# Patient Record
Sex: Female | Born: 2000 | Race: White | Hispanic: No | Marital: Single | Smoking: Never smoker
Health system: Southern US, Community
[De-identification: ages and names within clinical notes are randomized; demographics above are authoritative.]

## PROBLEM LIST (undated history)

## (undated) DIAGNOSIS — F419 Anxiety disorder, unspecified: Secondary | ICD-10-CM

## (undated) DIAGNOSIS — K5900 Constipation, unspecified: Secondary | ICD-10-CM

## (undated) DIAGNOSIS — J45909 Unspecified asthma, uncomplicated: Secondary | ICD-10-CM

## (undated) DIAGNOSIS — K219 Gastro-esophageal reflux disease without esophagitis: Secondary | ICD-10-CM

## (undated) DIAGNOSIS — E039 Hypothyroidism, unspecified: Secondary | ICD-10-CM

## (undated) DIAGNOSIS — F32A Depression, unspecified: Secondary | ICD-10-CM

## (undated) DIAGNOSIS — K2970 Gastritis, unspecified, without bleeding: Secondary | ICD-10-CM

## (undated) HISTORY — DX: Depression, unspecified: F32.A

## (undated) HISTORY — PX: APPENDECTOMY: SHX54

## (undated) HISTORY — DX: Gastro-esophageal reflux disease without esophagitis: K21.9

---
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
## 2021-01-09 ENCOUNTER — Ambulatory Visit (INDEPENDENT_AMBULATORY_CARE_PROVIDER_SITE_OTHER): Payer: BC Managed Care – PPO | Admitting: Allergy and Immunology

## 2021-01-09 ENCOUNTER — Encounter: Payer: Self-pay | Admitting: Allergy and Immunology

## 2021-01-09 ENCOUNTER — Other Ambulatory Visit: Payer: Self-pay

## 2021-01-09 VITALS — BP 98/70 | HR 98 | Resp 18 | Ht <= 58 in | Wt 93.2 lb

## 2021-01-09 DIAGNOSIS — L500 Allergic urticaria: Secondary | ICD-10-CM | POA: Diagnosis not present

## 2021-01-09 DIAGNOSIS — T50905D Adverse effect of unspecified drugs, medicaments and biological substances, subsequent encounter: Secondary | ICD-10-CM

## 2021-01-09 DIAGNOSIS — R220 Localized swelling, mass and lump, head: Secondary | ICD-10-CM | POA: Diagnosis not present

## 2021-01-09 DIAGNOSIS — T7840XD Allergy, unspecified, subsequent encounter: Secondary | ICD-10-CM

## 2021-01-09 DIAGNOSIS — R21 Rash and other nonspecific skin eruption: Secondary | ICD-10-CM

## 2021-01-09 DIAGNOSIS — T781XXD Other adverse food reactions, not elsewhere classified, subsequent encounter: Secondary | ICD-10-CM | POA: Diagnosis not present

## 2021-01-09 MED ORDER — AUVI-Q 0.3 MG/0.3ML IJ SOAJ: INTRAMUSCULAR | 3 refills | Status: AC

## 2021-01-09 NOTE — Progress Notes (Signed)
[address] [place] - [place] - [place]   Dear [name],  Thank you for referring Grace Parsons to the [facility] [place] on 01/09/2021.   Below is a summation of this patient's evaluation and recommendations.  Thank you for your referral. I will keep you informed about this patient's response to treatment.   If you have any questions please do not hesitate to contact me.   Sincerely,  Jessica Priest, MD Allergy / Immunology [facility]   ______________________________________________________________________    NEW PATIENT NOTE  Referring Provider: Morrell Riddle, PA-C Primary Provider: [name] Hacker, MD Date of office visit: 01/09/2021    Subjective:   Chief Complaint:  Grace Parsons (DOB: April 18, 2001) is a 20 y.o. female who presents to the clinic on 01/09/2021 with a chief complaint of Allergic Reaction (After taking Mefenamic Acid 250 mg) .     HPI: Grace Parsons presents to this clinic in evaluation of two issues.  First, she ended up in the emergency room on 15 December 2020 with a reaction directed against mefenamic acid.  She took 2 capsules of this agent for uterine cramps and within 1 hour she developed rash on her legs and then within another 30 minutes global urticaria and lip swelling and a throat type sensation for which she went to the emergency room and was treated with Pepcid and prednisone.  She was much better within about 1 hour after administration of those medicines and everything was dramatically improved within 6 hours after onset of her reaction when she woke up the next morning she had no symptoms.  She did have exposure to this agent 2 months prior during her menstrual period.  She has also had exposure to other nonsteroidal anti-inflammatory drugs including ibuprofen without any problem in the past.  Second, she gets very itchy throat and mouth when  eating bananas and peanuts and little Debbie frosting.  This has been an issue that has been present for years but has become much worse since around 2021.  She does not have any other associated atopic symptomatology.  Past Medical History:  Diagnosis Date  . Anxiety   . Asthma   . Constipation   . Depression    Phreesia 08/08/2020  . Gastritis   . GERD (gastroesophageal reflux disease)    Phreesia 08/08/2020  . Hypothyroid     Past Surgical History:  Procedure Laterality Date  . APPENDECTOMY    . LAPAROSCOPIC APPENDECTOMY N/A 07/17/2015   Procedure: APPENDECTOMY LAPAROSCOPIC;  Surgeon: Leonia Corona, MD;  Location: [facility] OR;  Service: Pediatrics;  Laterality: N/A;  . tubes in ears Bilateral 2004   x2    Allergies as of 01/09/2021      Reactions   Penicillins Hives   Buspirone Hcl Swelling   Throat tight and closing swelling sensation after 1 week of treatment   Lactose Intolerance (gi) Other (See Comments)   Abdominal pain   Other Hives   ANY "CILLIN" FAMILY MEDICATION   Penicillin G Hives   Amoxicillin Hives      Medication List      Loryna 3-0.02 MG tablet Generic drug: drospirenone-ethinyl estradiol Take 1 tablet by mouth daily.   mirtazapine 15 MG tablet Commonly known as: Remeron Take 1 tablet (15 mg total) by mouth at bedtime.       Review of systems negative except as noted in HPI / PMHx or  noted below:  Review of Systems  Constitutional: Negative.   HENT: Negative.   Eyes: Negative.   Respiratory: Negative.   Cardiovascular: Negative.   Gastrointestinal: Negative.   Genitourinary: Negative.   Musculoskeletal: Negative.   Skin: Negative.   Neurological: Negative.   Endo/Heme/Allergies: Negative.   Psychiatric/Behavioral: Negative.     Family History  Problem Relation Age of Onset  . Hypertension Mother   . Thyroid disease Maternal Uncle   . Diabetes Paternal Uncle   . Diabetes Maternal Grandmother   . Thyroid disease Maternal Grandmother    . Hypertension Maternal Grandmother   . Kidney disease Maternal Grandmother   . COPD Maternal Grandmother   . Asthma Maternal Grandmother   . Allergic rhinitis Father   . Asthma Brother     Social History   Socioeconomic History  . Marital status: Single    Spouse name: Not on file  . Number of children: Not on file  . Years of education: Not on file  . Highest education level: Not on file  Occupational History  . Not on file  Tobacco Use  . Smoking status: Never Smoker  . Smokeless tobacco: Never Used  Substance and Sexual Activity  . Alcohol use: Not on file  . Drug use: Not on file  . Sexual activity: Not on file  Other Topics Concern  . Not on file  Social History Narrative   Lives at home, is homeschooled is in 10th grade.    Environmental and Social history  Lives in a house with a dry environment, cat and dog located inside the household, carpet in the bedroom, no plastic on the bed, no plastic on the pillow, and no smoking ongoing with inside the household.  Objective:   Vitals:   01/09/21 1405  BP: 98/70  Pulse: 98  Resp: 18  SpO2: 97%   Height: 4\' 10"  (147.3 cm) Weight: 93 lb 3.2 oz (42.3 kg)  Physical Exam Constitutional:      Appearance: She is not diaphoretic.  HENT:     Head: Normocephalic. No right periorbital erythema or left periorbital erythema.     Right Ear: Tympanic membrane, ear canal and external ear normal.     Left Ear: Tympanic membrane, ear canal and external ear normal.     Nose: Nose normal. No mucosal edema or rhinorrhea.     Mouth/Throat:     Mouth: Oropharynx is clear and moist and mucous membranes are normal.     Pharynx: Uvula midline. No oropharyngeal exudate.  Eyes:     General: Lids are normal.     Conjunctiva/sclera: Conjunctivae normal.     Pupils: Pupils are equal, round, and reactive to light.  Neck:     Thyroid: No thyromegaly.     Trachea: Trachea normal. No tracheal tenderness or tracheal deviation.   Cardiovascular:     Rate and Rhythm: Normal rate and regular rhythm.     Heart sounds: Normal heart sounds, S1 normal and S2 normal. No murmur heard.   Pulmonary:     Effort: Pulmonary effort is normal. No respiratory distress.     Breath sounds: Normal breath sounds. No stridor. No wheezing or rales.  Chest:     Chest wall: No tenderness.  Abdominal:     General: There is no distension.     Palpations: Abdomen is soft. There is no hepatosplenomegaly or mass.     Tenderness: There is no abdominal tenderness. There is no guarding or rebound.  Musculoskeletal:  General: No tenderness or edema.  Lymphadenopathy:     Head:     Right side of head: No tonsillar adenopathy.     Left side of head: No tonsillar adenopathy.     Cervical: No cervical adenopathy.     Upper Body:  No axillary adenopathy present. Skin:    Coloration: Skin is not pale.     Findings: No erythema or rash.     Nails: There is no clubbing.  Neurological:     Mental Status: She is alert.     Diagnostics: Allergy skin tests were performed.  She did not demonstrate any hypersensitivity against a screening panel of aeroallergens.  Assessment and Plan:    1. Allergic reaction, subsequent encounter   2. Adverse effect of drug, subsequent encounter   3. Pollen-food allergy, subsequent encounter     1.  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  No mefenamic acid (NSAID)  3.  Auvi-Q 0.3, benadryl, MD/ER evaluation for allergic reaction  4. Further evaluation??? Yes, if recurrent allergic reactions.  5. NSAID use??? In clinic challenge???  Safiatou had a reaction to a nonsteroidal anti-inflammatory drug, specifically mefenamic acid.  She has taken ibuprofen in the past with no problem but I did inform her that if she plans on using nonsteroidal anti-inflammatory drugs in the future it would be best to come into the clinic to undergo a in clinic drug challenge with ibuprofen to make sure  that her sensitivity directed against mefenamic acid is unique to that drug and not directed against a broad class of nonsteroidal anti-inflammatory drugs.  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  I did inform her that we usually do not advise eating foods that cause itchiness of the mouth or throat.  Jessica Priest, MD Allergy / Immunology [facility] [place]

## 2021-01-09 NOTE — Patient Instructions (Addendum)
  1.  Allergen avoidance measures oral allergy syndrome  2.  No mefenamic acid (NSAID)  3.  Auvi-Q 0.3, benadryl, MD/ER evaluation for allergic reaction  4. Further evaluation??? Yes, if recurrent allergic reactions.  5. NSAID use??? In clinic challenge???

## 2021-01-10 ENCOUNTER — Encounter: Payer: Self-pay | Admitting: Allergy and Immunology

## 2021-03-27 ENCOUNTER — Telehealth: Payer: BC Managed Care – PPO | Admitting: Family

## 2021-03-27 DIAGNOSIS — N946 Dysmenorrhea, unspecified: Secondary | ICD-10-CM

## 2021-03-27 DIAGNOSIS — N921 Excessive and frequent menstruation with irregular cycle: Secondary | ICD-10-CM | POA: Diagnosis not present

## 2021-03-27 NOTE — Progress Notes (Signed)
THIS RECORD MAY CONTAIN CONFIDENTIAL INFORMATION THAT SHOULD NOT BE RELEASED WITHOUT REVIEW OF THE SERVICE PROVIDER.  Virtual Follow-Up Visit via Video Note  I connected with Grace Parsons  on 03/27/21 at  2:30 PM EDT by a video enabled telemedicine application and verified that I am speaking with the correct person using two identifiers.   Patient/parent location: home   I discussed the limitations of evaluation and management by telemedicine and the availability of in person appointments.  I discussed that the purpose of this telehealth visit is to provide medical care while limiting exposure to the novel coronavirus.  The patient expressed understanding and agreed to proceed.   Grace Parsons is a 20 y.o. female referred by [name] Hacker, MD here today for follow-up of dysmenorrhea, menorrhagia with irregular cycle.   History was provided by the patient.  Supervising Physician: Dr. Delorse Lek  Plan from Last Visit:   drospirenone-ethinyl estradiol (Loryna) 3-0.02 mg  Remeron 15 mg   Chief Complaint: Dysmenorrhea   History of Present Illness:  -nexplanon prior - started getting migraines about a month into the implant  -after removal, has been on OCPs - usually when first starts taking it cramping, period is gone but once body gets used to it, cramping and periods return  -has been rotating through pills to figure out what pills  -LMP 2 weeks ago, only bled 5 days, usually 7 days  -bleeding extremely heavy - changes pad every 2 hours for leakage, dizzy spells -thyroid hx: dizzy spells, stomach pains - was put on synthroid - felt throat closed;  -this month, frequently forgets throughout the week; period was lighter during period  -taking it when she remembers  -not sexually active  -significant FH of endometriosis requiring hysterectomy management   Allergies  Allergen Reactions  . Penicillins Hives  . Buspirone Hcl Swelling    Throat tight and closing swelling  sensation after 1 week of treatment  . Lactose Intolerance (Gi) Other (See Comments)    Abdominal pain  . Other Hives    ANY "CILLIN" FAMILY MEDICATION  . Penicillin G Hives  . Amoxicillin Hives   Outpatient Medications Prior to Visit  Medication Sig Dispense Refill  . AUVI-Q 0.3 MG/0.3ML SOAJ injection Use as directed for life threatening allergic reactions 2 each 3  . drospirenone-ethinyl estradiol (LORYNA) 3-0.02 MG tablet Take 1 tablet by mouth daily.    . mirtazapine (REMERON) 15 MG tablet Take 1 tablet (15 mg total) by mouth at bedtime. 90 tablet 1   No facility-administered medications prior to visit.     Patient Active Problem List   Diagnosis Date Noted  . Dysmenorrhea 04/03/2020  . History of hypothyroidism 10/07/2018  . Chronic post-traumatic stress disorder 08/31/2018  . Mild major depression, single episode (HCC) 08/31/2018  . GAD (generalized anxiety disorder) 07/31/2018  . Agoraphobia 07/31/2018  . Vitamin D deficiency 12/06/2015  . Secondary hyperparathyroidism (HCC) 12/06/2015  . Acute appendicitis 07/17/2015  . Suppurative appendicitis 07/17/2015  . Thyroiditis, autoimmune 05/23/2015  . Goiter 05/23/2015  . Lactose intolerance 05/23/2015  . 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 Abdominal bloating 09/05/2014  . Abdominal pain 09/05/2014  . Diarrhea 09/05/2014  . Constipation 09/05/2014  . 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  Visual Observations/Objective:  General Appearance: Well nourished well developed,  in no apparent distress.  Eyes: conjunctiva no swelling or erythema ENT/Mouth: No hoarseness, No cough for duration of visit.  Neck: Supple  Respiratory: Respiratory effort normal, normal rate, no retractions or  distress.   Cardio: Appears well-perfused, noncyanotic Musculoskeletal: no obvious deformity Skin: visible skin without rashes, ecchymosis, erythema Neuro: Awake and oriented X 3,  Psych:  normal affect, Insight and Judgment appropriate.    Assessment/Plan: 1. Dysmenorrhea 2. Menorrhagia with irregular cycle 3. Breakthrough bleeding with birth control pills  - TSH - T4, free - APTT - CBC with Differential/Platelet - Ferritin - Prolactin - Protime-INR - [facility] COMPREHENSIVE PANEL - Comprehensive metabolic panel - DHEA-sulfate - Follicle stimulating hormone - Testos,Total,Free and SHBG (Female) - Luteinizing hormone  Grace Parsons is an 20 yo assigned female at birth/identifies as female with significant dysmenorrhea and menorrhagia with irregular cycle, not improved with nexplanon or multiple OCP trials. Current OCP is drospirenone ethinyl estradiol with breakthrough bleeding. Family history significant for endometriosis. Discussed Tier 1 and Tier 2 methods, including IUD, Implant, pill, patch, ring, depo - patient would like to trial patch with consideration for IUD as pre-medicated procedure in the future.  Patient desires trial of patch; low threshold for GYN referral for further workup for endometriosis if no improvement in pain. Will recheck thyroid labs, blood dyscrasia workup, prolactin for breakthrough bleeding etiologies. Discussed continuous cycling with patch and reporting side effects. Advised more estrogen over time with patch, return precautions given.   BH screenings:  PHQ-SADS Last 3 Score only 04/03/2020  PHQ-15 Score 9  Total GAD-7 Score 6  PHQ-9 Total Score 2    Screens discussed with patient and parent and adjustments to plan made accordingly.   I discussed the assessment and treatment plan with the patient and/or parent/guardian.  They were provided an opportunity to ask questions and all were answered.  They agreed with the plan and demonstrated an  understanding of the instructions. They were advised to call back or seek an in-person evaluation in the emergency room if the symptoms worsen or if the condition fails to improve as anticipated.   Follow-up:   8 weeks or sooner  Medical decision-making:   I spent 30 minutes on this telehealth visit inclusive of face-to-face video and care coordination time I was located remote in [place] during this encounter.   Georges Mouse, NP    CC: [name] Hacker, MD, [name] Hacker, MD

## 2021-03-28 ENCOUNTER — Encounter: Payer: Self-pay | Admitting: Family

## 2021-03-28 ENCOUNTER — Other Ambulatory Visit: Payer: Self-pay | Admitting: Family

## 2021-03-28 MED ORDER — NORELGESTROMIN-ETH ESTRADIOL 150-35 MCG/24HR TD PTWK: 1.0000 | MEDICATED_PATCH | TRANSDERMAL | 3 refills | Status: DC

## 2021-09-19 ENCOUNTER — Ambulatory Visit: Payer: BC Managed Care – PPO | Admitting: Pediatrics

## 2021-09-19 ENCOUNTER — Other Ambulatory Visit: Payer: Self-pay

## 2021-09-19 VITALS — BP 121/76 | HR 122 | Ht <= 58 in | Wt 86.7 lb

## 2021-09-19 DIAGNOSIS — N6452 Nipple discharge: Secondary | ICD-10-CM

## 2021-09-19 DIAGNOSIS — N946 Dysmenorrhea, unspecified: Secondary | ICD-10-CM | POA: Diagnosis not present

## 2021-09-19 DIAGNOSIS — F4000 Agoraphobia, unspecified: Secondary | ICD-10-CM

## 2021-09-19 DIAGNOSIS — F411 Generalized anxiety disorder: Secondary | ICD-10-CM

## 2021-09-19 DIAGNOSIS — R234 Changes in skin texture: Secondary | ICD-10-CM

## 2021-09-19 NOTE — Progress Notes (Signed)
History was provided by the patient and mother.  Grace Parsons is a 20 y.o. female who is here for GAD, agorophobia, dysmenorrhea, breast concerns.  [name] , MD   HPI:  Pt reports that she stopped taking her birth control prior to March because she was worried about the health side effects that could be related to it like stroke and heart problems. Has had some rough periods since stopping OCP. Has had some inconsistencies in cycles since September. Can be later or shorter.   Has been having some breast pain, rash and feels like she has a lump in her breast. She has had nipple discharge, areola has looked different and she will have nipple pain at times where it will turn white. Was not stimulating nipple, yellowish fluid. First started Monday and happened 4 times since then.   Not still taking mirtazepine. She would say she is "almost cured" of her anxiety and is going out a lot more. Mom reports medical things can raise her anxiety.   Reports eating has been good, mom might disagree. She will sleep later and eat only about 2 meals a day. Started weight loss after stopping OCP.     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 (Patient not taking: Reported on 09/19/2021) 90 tablet 1   norelgestromin-ethinyl estradiol (ORTHO EVRA) 150-35 MCG/24HR transdermal patch Place 1 patch onto the skin once a week. (Patient not taking: Reported on 09/19/2021) 12 patch 3   No current facility-administered medications on file prior to visit.    Allergies  Allergen Reactions   Penicillins Hives   Buspirone Hcl Swelling    Throat tight and closing swelling sensation after 1 week of treatment   Lactose Intolerance (Gi) Other (See Comments)    Abdominal pain   Other Hives    ANY "CILLIN" FAMILY MEDICATION   Penicillin G Hives   Amoxicillin Hives    Physical Exam:    Vitals:   09/19/21 1503  BP: 121/76  Pulse: (!) 122  Weight: 86 lb 11.2 oz (39.3 kg)  Height: 4\' 10"  (1.473 m)    Blood pressure percentiles are not available for patients who are 18 years or older.  Physical Exam Vitals and nursing note reviewed.  Constitutional:      General: She is not in acute distress.    Appearance: She is well-developed.  Neck:     Thyroid: No thyromegaly.  Cardiovascular:     Rate and Rhythm: Normal rate and regular rhythm.     Heart sounds: No murmur heard. Pulmonary:     Breath sounds: Normal breath sounds.  Chest:  Breasts:    Tanner Score is 5.     Breasts are asymmetrical.     Right: Normal.     Left: Normal.     Comments: Right breast somewhat larger than left. No notable skin changes or masses palpated. No nipple discharge able to be elicited  Abdominal:     Palpations: Abdomen is soft. There is no mass.     Tenderness: There is no abdominal tenderness. There is no guarding.  Musculoskeletal:     Right lower leg: No edema.     Left lower leg: No edema.  Lymphadenopathy:     Cervical: No cervical adenopathy.     Upper Body:      Right upper body: No supraclavicular or axillary adenopathy.     Left upper body: No supraclavicular or axillary adenopathy.  Skin:    General: Skin is warm.     Findings: No rash.  Neurological:     Mental Status: She is alert.     Comments: No tremor  Psychiatric:        Mood and Affect: Mood is anxious.    Assessment/Plan: 1. Dysmenorrhea Not currently using ocp or patch. Feels that her periods are currently managable. We discussed risks and benefits of hormone use.   2. GAD (generalized anxiety disorder) Not currently taking mirtazapine. Suspect she would benefit from restarting but she declined today.   3. Agoraphobia Going out more now, feels happy with where she is.   4. Nipple discharge Labs today drawn before breast exam. Not able to elicit any on exam.  - TSH + free T4 - Prolactin - CBC with Differential/Platelet - Comprehensive metabolic panel  5. Breast skin changes Pt reports changes in the areola and sometimes color of the nipple. Breasts appeared entirely normal to me today. Will obtain labs, could consider [place] if she wishes.  - TSH + free T4 - Prolactin - CBC with Differential/Platelet - Comprehensive metabolic panel  Return PRN pending labs.   Alfonso Ramus, FNP

## 2021-09-19 NOTE — Patient Instructions (Signed)
Labs today We can order an ultrasound for reassurance  Let me know if you want to restart your mirtazapine to help with anxiety and appetite

## 2021-09-20 LAB — CBC WITH DIFFERENTIAL/PLATELET
Absolute Monocytes: 598 cells/uL (ref 200–950)
Basophils Absolute: 44 cells/uL (ref 0–200)
Basophils Relative: 0.5 %
Eosinophils Absolute: 62 cells/uL (ref 15–500)
Eosinophils Relative: 0.7 %
HCT: 42.2 % (ref 35.0–45.0)
Hemoglobin: 14.4 g/dL (ref 11.7–15.5)
Lymphs Abs: 1954 cells/uL (ref 850–3900)
MCH: 31.4 pg (ref 27.0–33.0)
MCHC: 34.1 g/dL (ref 32.0–36.0)
MCV: 92.1 fL (ref 80.0–100.0)
MPV: 10.9 fL (ref 7.5–12.5)
Monocytes Relative: 6.8 %
Neutro Abs: 6142 cells/uL (ref 1500–7800)
Neutrophils Relative %: 69.8 %
Platelets: 266 10*3/uL (ref 140–400)
RBC: 4.58 10*6/uL (ref 3.80–5.10)
RDW: 11.7 % (ref 11.0–15.0)
Total Lymphocyte: 22.2 %
WBC: 8.8 10*3/uL (ref 3.8–10.8)

## 2021-09-20 LAB — COMPREHENSIVE METABOLIC PANEL
AG Ratio: 1.9 (calc) (ref 1.0–2.5)
ALT: 13 U/L (ref 5–32)
AST: 18 U/L (ref 12–32)
Albumin: 5.1 g/dL (ref 3.6–5.1)
Alkaline phosphatase (APISO): 54 U/L (ref 36–128)
BUN: 10 mg/dL (ref 7–20)
CO2: 21 mmol/L (ref 20–32)
Calcium: 9.8 mg/dL (ref 8.9–10.4)
Chloride: 105 mmol/L (ref 98–110)
Creat: 0.6 mg/dL (ref 0.50–0.96)
Globulin: 2.7 g/dL (calc) (ref 2.0–3.8)
Glucose, Bld: 91 mg/dL (ref 65–99)
Potassium: 4.3 mmol/L (ref 3.8–5.1)
Sodium: 138 mmol/L (ref 135–146)
Total Bilirubin: 0.9 mg/dL (ref 0.2–1.1)
Total Protein: 7.8 g/dL (ref 6.3–8.2)

## 2021-09-20 LAB — TSH+FREE T4: TSH W/REFLEX TO FT4: 2.66 mIU/L

## 2021-09-20 LAB — PROLACTIN: Prolactin: 15.9 ng/mL

## 2023-12-01 ENCOUNTER — Other Ambulatory Visit: Payer: Self-pay

## 2023-12-01 ENCOUNTER — Encounter (HOSPITAL_BASED_OUTPATIENT_CLINIC_OR_DEPARTMENT_OTHER): Payer: Self-pay | Admitting: Radiology

## 2023-12-01 ENCOUNTER — Emergency Department (HOSPITAL_BASED_OUTPATIENT_CLINIC_OR_DEPARTMENT_OTHER)
Admission: EM | Admit: 2023-12-01 | Discharge: 2023-12-01 | Disposition: A | Discharge status: Home / Self Care | Payer: 59 | Attending: Emergency Medicine | Admitting: Emergency Medicine

## 2023-12-01 DIAGNOSIS — E876 Hypokalemia: Secondary | ICD-10-CM | POA: Diagnosis not present

## 2023-12-01 DIAGNOSIS — R1013 Epigastric pain: Secondary | ICD-10-CM | POA: Diagnosis present

## 2023-12-01 DIAGNOSIS — R110 Nausea: Secondary | ICD-10-CM | POA: Diagnosis not present

## 2023-12-01 DIAGNOSIS — R1012 Left upper quadrant pain: Secondary | ICD-10-CM | POA: Diagnosis not present

## 2023-12-01 LAB — COMPREHENSIVE METABOLIC PANEL
ALT: 17 U/L (ref 0–44)
AST: 19 U/L (ref 15–41)
Albumin: 4.6 g/dL (ref 3.5–5.0)
Alkaline Phosphatase: 40 U/L (ref 38–126)
Anion gap: 9 (ref 5–15)
BUN: 9 mg/dL (ref 6–20)
CO2: 25 mmol/L (ref 22–32)
Calcium: 9 mg/dL (ref 8.9–10.3)
Chloride: 103 mmol/L (ref 98–111)
Creatinine, Ser: 0.45 mg/dL (ref 0.44–1.00)
GFR, Estimated: >60 mL/min (ref >60)
Glucose, Bld: 91 mg/dL (ref 70–99)
Potassium: 3.4 mmol/L — ABNORMAL LOW (ref 3.5–5.1)
Sodium: 137 mmol/L (ref 135–145)
Total Bilirubin: 0.6 mg/dL (ref 0.0–1.2)
Total Protein: 7.5 g/dL (ref 6.5–8.1)

## 2023-12-01 LAB — URINALYSIS, ROUTINE W REFLEX MICROSCOPIC
Bilirubin Urine: NEGATIVE
Glucose, UA: NEGATIVE mg/dL
Hgb urine dipstick: NEGATIVE
Ketones, ur: NEGATIVE mg/dL
Nitrite: NEGATIVE
Protein, ur: NEGATIVE mg/dL
Specific Gravity, Urine: 1.015 (ref 1.005–1.030)
pH: 7 (ref 5.0–8.0)

## 2023-12-01 LAB — CBC
HCT: 38 % (ref 36.0–46.0)
Hemoglobin: 13.2 g/dL (ref 12.0–15.0)
MCH: 31.4 pg (ref 26.0–34.0)
MCHC: 34.7 g/dL (ref 30.0–36.0)
MCV: 90.5 fL (ref 80.0–100.0)
Platelets: 236 10*3/uL (ref 150–400)
RBC: 4.2 MIL/uL (ref 3.87–5.11)
RDW: 11.8 % (ref 11.5–15.5)
WBC: 7.4 10*3/uL (ref 4.0–10.5)
nRBC: 0 % (ref 0.0–0.2)

## 2023-12-01 LAB — URINALYSIS, MICROSCOPIC (REFLEX): RBC / HPF: NONE SEEN RBC/hpf (ref 0–5)

## 2023-12-01 LAB — PREGNANCY, URINE: Preg Test, Ur: NEGATIVE

## 2023-12-01 LAB — LIPASE, BLOOD: Lipase: 34 U/L (ref 11–51)

## 2023-12-01 MED ORDER — LIDOCAINE VISCOUS HCL 2 % MT SOLN
15.0000 mL | Freq: Once | OROMUCOSAL | Status: AC
  Administered 2023-12-01: 15 mL via ORAL
  Filled 2023-12-01: qty 15

## 2023-12-01 MED ORDER — SUCRALFATE 1 G PO TABS: 1.0000 g | ORAL_TABLET | Freq: Three times a day (TID) | ORAL | 0 refills | Status: AC

## 2023-12-01 MED ORDER — FAMOTIDINE 20 MG PO TABS
20.0000 mg | ORAL_TABLET | Freq: Once | ORAL | Status: AC
  Administered 2023-12-01: 20 mg via ORAL
  Filled 2023-12-01: qty 1

## 2023-12-01 MED ORDER — ALUM & MAG HYDROXIDE-SIMETH 200-200-20 MG/5ML PO SUSP
30.0000 mL | Freq: Once | ORAL | Status: AC
  Administered 2023-12-01: 30 mL via ORAL
  Filled 2023-12-01: qty 30

## 2023-12-01 MED ORDER — POTASSIUM CHLORIDE CRYS ER 20 MEQ PO TBCR
40.0000 meq | EXTENDED_RELEASE_TABLET | Freq: Once | ORAL | Status: AC
  Administered 2023-12-01: 40 meq via ORAL
  Filled 2023-12-01: qty 2

## 2023-12-01 MED ORDER — PANTOPRAZOLE SODIUM 20 MG PO TBEC: 20.0000 mg | DELAYED_RELEASE_TABLET | Freq: Every day | ORAL | 0 refills | Status: AC

## 2023-12-01 NOTE — ED Provider Notes (Signed)
 [facility] Provider Note   CSN: [id number] Arrival date & time: 12/01/23  1637     History  Chief Complaint  Patient presents with   Abdominal Pain    Grace Parsons is a 23 y.o. female.  With history of anxiety, depression, GERD, gastritis presenting to the ED for evaluation of upper abdominal pain.  Pain is mostly epigastric and left upper quadrant.  She states the symptoms began 4 days ago.  While at work this morning her pain became worse.  She works in [employer] and walks and moves a lot.  Pain is constant but waxes and wanes in intensity.  Described as a sharp pain.  No aggravating or alleviating factors.  Pain does not radiate.  No back pain or shoulder pain.  She reports some nausea at baseline which is slightly worse secondary to the pain.  No vomiting.  No lower abdominal pain, dysuria, urgency, hematuria, vaginal complaints.  No constipation or diarrhea.  She does report urinary frequency.  No fevers or chills.  She denies smoking, alcohol use, recreational drug use.  No chest pain or shortness of breath.   Abdominal Pain Associated symptoms: nausea        Home Medications Prior to Admission medications   Medication Sig Start Date End Date Taking? Authorizing Provider  pantoprazole  (PROTONIX ) 20 MG tablet Take 1 tablet (20 mg total) by mouth daily. 12/01/23  Yes Saafir Abdullah, Marsa HERO, PA-C  sucralfate  (CARAFATE ) 1 g tablet Take 1 tablet (1 g total) by mouth 4 (four) times daily -  with meals and at bedtime for 5 days. 12/01/23 12/06/23 Yes Tanysha Quant, Marsa HERO, PA-C  AUVI-Q  0.3 MG/0.3ML SOAJ injection Use as directed for life threatening allergic reactions Patient not taking: Reported on 09/19/2021 01/09/21   Kozlow, Eric J, MD      Allergies    Penicillins, Buspirone  hcl, Ibuprofen, Lactose intolerance (gi), Other, Penicillin g, and Amoxicillin    Review of Systems   Review of Systems  Gastrointestinal:  Positive for  abdominal pain and nausea.  All other systems reviewed and are negative.   Physical Exam Updated Vital Signs BP 119/78 (BP Location: Right Arm)   Pulse 77   Temp 98.2 F (36.8 C) (Oral)   Resp 15   Ht 4' 9 (1.448 m)   Wt 39.9 kg   LMP 11/24/2023   SpO2 100%   BMI 19.04 kg/m  Physical Exam Vitals and nursing note reviewed.  Constitutional:      General: She is not in acute distress.    Appearance: Normal appearance. She is normal weight. She is not ill-appearing.     Comments: Resting comfortably in bed  HENT:     Head: Normocephalic and atraumatic.  Pulmonary:     Effort: Pulmonary effort is normal. No respiratory distress.     Breath sounds: No wheezing, rhonchi or rales.  Abdominal:     General: Abdomen is flat. Bowel sounds are normal.     Palpations: Abdomen is soft.     Tenderness: There is abdominal tenderness in the epigastric area. There is no right CVA tenderness, left CVA tenderness or guarding. Negative signs include Murphy's sign and McBurney's sign.  Musculoskeletal:        General: Normal range of motion.     Cervical back: Neck supple.  Skin:    General: Skin is warm and dry.  Neurological:     Mental Status: She is alert and oriented  to person, place, and time.  Psychiatric:        Mood and Affect: Mood normal.        Behavior: Behavior normal.     ED Results / Procedures / Treatments   Labs (all labs ordered are listed, but only abnormal results are displayed) Labs Reviewed  COMPREHENSIVE METABOLIC PANEL - Abnormal; Notable for the following components:      Result Value   Potassium 3.4 (*)    All other components within normal limits  URINALYSIS, ROUTINE W REFLEX MICROSCOPIC - Abnormal; Notable for the following components:   Leukocytes,Ua TRACE (*)    All other components within normal limits  URINALYSIS, MICROSCOPIC (REFLEX) - Abnormal; Notable for the following components:   Bacteria, UA RARE (*)    All other components within normal  limits  LIPASE, BLOOD  CBC  PREGNANCY, URINE    EKG None  Radiology No results found.  Procedures Procedures    Medications Ordered in ED Medications  potassium chloride  SA (KLOR-CON  M) CR tablet 40 mEq (40 mEq Oral Given 12/01/23 2038)  famotidine  (PEPCID ) tablet 20 mg (20 mg Oral Given 12/01/23 2037)  alum & mag hydroxide-simeth (MAALOX/MYLANTA) 200-200-20 MG/5ML suspension 30 mL (30 mLs Oral Given 12/01/23 2039)    And  lidocaine  (XYLOCAINE ) 2 % viscous mouth solution 15 mL (15 mLs Oral Given 12/01/23 2039)    ED Course/ Medical Decision Making/ A&P                                 Medical Decision Making Amount and/or Complexity of Data Reviewed Labs: ordered.  Risk OTC drugs. Prescription drug management.  This patient presents to the ED for concern of epigastric abdominal pain, this involves an extensive number of treatment options, and is a complaint that carries with it a high risk of complications and morbidity.  Differential diagnosis of epigastric pain includes: Functional or nonulcer dyspepsia, PUD, GERD, Gastritis, (NSAIDs, alcohol, stress, H. pylori, pernicious anemia), pancreatitis or pancreatic cancer, overeating indigestion (high-fat foods, coffee), drugs (aspirin, antibiotics (eg, macrolides, metronidazole), corticosteroids, digoxin, narcotics, theophylline), gastroparesis, lactose intolerance, malabsorption gastric cancer, parasitic infection, (Giardia, Strongyloides, Ascaris) cholelithiasis, choledocholithiasis, or cholangitis, ACS, pericarditis, pneumonia, abdominal hernia, pregnancy, intestinal ischemia, esophageal rupture, gastric volvulus, hepatitis.   My initial workup includes labs, symptom control  Additional history obtained from: Nursing notes from this visit. Family mother at bedside provides portion of the history  I ordered, reviewed and interpreted labs which include: CBC, CMP, lipase, urinalysis, urine pregnancy.  No leukocytosis or anemia.   Mild hypokalemia 3.4.  No significant electrolyte derangement or kidney dysfunction.  Normal LFTs.  Urine with trace leukocytes and rare bacteria but 0 white blood cells.  Afebrile, hemodynamically stable.  23 year old female presenting to the ED for evaluation of epigastric and left upper quadrant abdominal pain.  Symptoms have been present for a few days.  Worsened earlier this morning.  Some nausea but no vomiting.  Lab workup was reassuring.  Hypokalemia treated in the emergency department.  Negative Murphy sign.  Normal LFTs.  Lower suspicion for cholecystitis.  No respiratory complaints, lower suspicion for lower lobe lung disease.  Overall suspect gastritis.  She was treated in the emergency department with improvement in her symptoms.  She was sent prescription for Protonix  and Carafate  for symptoms.  She was encouraged to follow-up with her primary care provider for reevaluation.  She was given return precautions.  Stable  at discharge.  At this time there does not appear to be any evidence of an acute emergency medical condition and the patient appears stable for discharge with appropriate outpatient follow up. Diagnosis was discussed with patient who verbalizes understanding of care plan and is agreeable to discharge. I have discussed return precautions with patient and mother who verbalizes understanding. Patient encouraged to follow-up with their PCP within 1 week. All questions answered.  Note: Portions of this report may have been transcribed using voice recognition software. Every effort was made to ensure accuracy; however, inadvertent computerized transcription errors may still be present.         Final Clinical Impression(s) / ED Diagnoses Final diagnoses:  Epigastric abdominal pain    Rx / DC Orders ED Discharge Orders          Ordered    pantoprazole  (PROTONIX ) 20 MG tablet  Daily        12/01/23 2112    sucralfate  (CARAFATE ) 1 g tablet  3 times daily with meals & bedtime         12/01/23 2112              Edwardo Marsa CHRISTELLA DEVONNA 12/01/23 2112    Dreama Longs, MD 12/02/23 1425

## 2023-12-01 NOTE — ED Notes (Signed)
 AVS provided by edp was discussed with pt. Pt verbalized understanding with no additional questions at this time. Pt confirmed pharmacy. Going home with family at bedside

## 2023-12-01 NOTE — ED Triage Notes (Signed)
 Pt states that Friday she started having left upper quadrant pain that has continued. Today while at work it began hurting worse. Pt states she works in [employer] and her job is hard and she thinks that is exacerbating it. Pt states she has nausea every single day of her life because she is a nervous person but today the nausea was a different nausea. Denies diarrhea or vomiting.

## 2023-12-01 NOTE — Discharge Instructions (Signed)
 You have been seen today for your complaint of abdominal pain. Your lab work was reassuring. Your discharge medications include Protonix .  This is a medicine used to help with your abdominal pain.  Take this once daily for at least 30 days. Carafate .  This is another medicine used to help with your abdominal pain.  Take this as prescribed and for the entire duration of the prescription. Follow up with: Your primary care provider in 1 week for reevaluation Please seek immediate medical care if you develop any of the following symptoms: You vomit blood or a substance that looks like coffee grounds. You have black or dark red stools. You are unable to keep fluids down. At this time there does not appear to be the presence of an emergent medical condition, however there is always the potential for conditions to change. Please read and follow the below instructions.  Do not take your medicine if  develop an itchy rash, swelling in your mouth or lips, or difficulty breathing; call 911 and seek immediate emergency medical attention if this occurs.  You may review your lab tests and imaging results in their entirety on your MyChart account.  Please discuss all results of fully with your primary care provider and other specialist at your follow-up visit.  Note: Portions of this text may have been transcribed using voice recognition software. Every effort was made to ensure accuracy; however, inadvertent computerized transcription errors may still be present.
